# Patient Record
Sex: Female | Born: 1950 | Race: Black or African American | Hispanic: No | Smoking: Never smoker
Health system: Southern US, Community
[De-identification: ages and names within clinical notes are randomized; demographics above are authoritative.]

## PROBLEM LIST (undated history)

## (undated) DIAGNOSIS — E119 Type 2 diabetes mellitus without complications: Secondary | ICD-10-CM

## (undated) DIAGNOSIS — I10 Essential (primary) hypertension: Secondary | ICD-10-CM

## (undated) HISTORY — PX: ABDOMINAL HYSTERECTOMY: SHX81

---

## 1998-05-09 ENCOUNTER — Other Ambulatory Visit: Admission: RE | Admit: 1998-05-09 | Discharge: 1998-05-09 | Payer: Self-pay | Admitting: Obstetrics & Gynecology

## 1998-05-11 ENCOUNTER — Ambulatory Visit (HOSPITAL_COMMUNITY): Admission: RE | Admit: 1998-05-11 | Discharge: 1998-05-11 | Payer: Self-pay | Admitting: Obstetrics & Gynecology

## 1998-05-12 ENCOUNTER — Ambulatory Visit (HOSPITAL_COMMUNITY): Admission: RE | Admit: 1998-05-12 | Discharge: 1998-05-12 | Payer: Self-pay | Admitting: *Deleted

## 1998-05-19 ENCOUNTER — Ambulatory Visit (HOSPITAL_COMMUNITY): Admission: RE | Admit: 1998-05-19 | Discharge: 1998-05-19 | Payer: Self-pay | Admitting: *Deleted

## 1998-08-15 ENCOUNTER — Ambulatory Visit (HOSPITAL_COMMUNITY): Admission: RE | Admit: 1998-08-15 | Discharge: 1998-08-15 | Payer: Self-pay | Admitting: *Deleted

## 1998-11-30 ENCOUNTER — Encounter: Payer: Self-pay | Admitting: *Deleted

## 1998-11-30 ENCOUNTER — Ambulatory Visit (HOSPITAL_COMMUNITY): Admission: RE | Admit: 1998-11-30 | Discharge: 1998-11-30 | Payer: Self-pay | Admitting: *Deleted

## 1999-06-22 ENCOUNTER — Ambulatory Visit (HOSPITAL_COMMUNITY): Admission: RE | Admit: 1999-06-22 | Discharge: 1999-06-22 | Payer: Self-pay | Admitting: *Deleted

## 1999-06-22 ENCOUNTER — Encounter: Payer: Self-pay | Admitting: *Deleted

## 1999-06-29 ENCOUNTER — Encounter: Payer: Self-pay | Admitting: *Deleted

## 1999-06-29 ENCOUNTER — Ambulatory Visit (HOSPITAL_COMMUNITY): Admission: RE | Admit: 1999-06-29 | Discharge: 1999-06-29 | Payer: Self-pay | Admitting: *Deleted

## 1999-10-05 ENCOUNTER — Other Ambulatory Visit: Admission: RE | Admit: 1999-10-05 | Discharge: 1999-10-05 | Payer: Self-pay | Admitting: Obstetrics & Gynecology

## 2000-01-19 ENCOUNTER — Encounter: Payer: Self-pay | Admitting: *Deleted

## 2000-01-19 ENCOUNTER — Encounter: Admission: RE | Admit: 2000-01-19 | Discharge: 2000-01-19 | Payer: Self-pay | Admitting: *Deleted

## 2000-07-18 ENCOUNTER — Encounter: Payer: Self-pay | Admitting: *Deleted

## 2000-07-18 ENCOUNTER — Encounter: Admission: RE | Admit: 2000-07-18 | Discharge: 2000-07-18 | Payer: Self-pay | Admitting: *Deleted

## 2001-11-27 ENCOUNTER — Other Ambulatory Visit: Admission: RE | Admit: 2001-11-27 | Discharge: 2001-11-27 | Payer: Self-pay | Admitting: Obstetrics and Gynecology

## 2001-12-03 ENCOUNTER — Ambulatory Visit (HOSPITAL_COMMUNITY): Admission: RE | Admit: 2001-12-03 | Discharge: 2001-12-03 | Payer: Self-pay | Admitting: Obstetrics and Gynecology

## 2001-12-03 ENCOUNTER — Encounter: Payer: Self-pay | Admitting: Obstetrics and Gynecology

## 2002-05-09 ENCOUNTER — Encounter: Payer: Self-pay | Admitting: Emergency Medicine

## 2002-05-09 ENCOUNTER — Emergency Department (HOSPITAL_COMMUNITY): Admission: EM | Admit: 2002-05-09 | Discharge: 2002-05-09 | Payer: Self-pay | Admitting: Emergency Medicine

## 2003-05-21 ENCOUNTER — Encounter: Payer: Self-pay | Admitting: Internal Medicine

## 2003-05-21 ENCOUNTER — Ambulatory Visit (HOSPITAL_COMMUNITY): Admission: RE | Admit: 2003-05-21 | Discharge: 2003-05-21 | Payer: Self-pay | Admitting: Internal Medicine

## 2006-01-21 ENCOUNTER — Ambulatory Visit (HOSPITAL_COMMUNITY): Admission: RE | Admit: 2006-01-21 | Discharge: 2006-01-21 | Payer: Self-pay | Admitting: Internal Medicine

## 2007-01-24 ENCOUNTER — Ambulatory Visit (HOSPITAL_COMMUNITY): Admission: RE | Admit: 2007-01-24 | Discharge: 2007-01-24 | Payer: Self-pay | Admitting: Internal Medicine

## 2010-11-09 ENCOUNTER — Encounter
Admission: RE | Admit: 2010-11-09 | Discharge: 2010-11-09 | Payer: Self-pay | Source: Home / Self Care | Attending: Internal Medicine | Admitting: Internal Medicine

## 2011-12-18 ENCOUNTER — Other Ambulatory Visit (HOSPITAL_COMMUNITY): Payer: Self-pay | Admitting: Internal Medicine

## 2011-12-18 DIAGNOSIS — Z1231 Encounter for screening mammogram for malignant neoplasm of breast: Secondary | ICD-10-CM

## 2011-12-19 ENCOUNTER — Ambulatory Visit (HOSPITAL_COMMUNITY): Payer: Self-pay

## 2011-12-27 ENCOUNTER — Ambulatory Visit (HOSPITAL_COMMUNITY): Payer: Self-pay

## 2012-01-24 ENCOUNTER — Ambulatory Visit (HOSPITAL_COMMUNITY)
Admission: RE | Admit: 2012-01-24 | Discharge: 2012-01-24 | Disposition: A | Discharge status: Home / Self Care | Payer: BC Managed Care – PPO | Source: Ambulatory Visit | Attending: Internal Medicine | Admitting: Internal Medicine

## 2012-01-24 DIAGNOSIS — Z1231 Encounter for screening mammogram for malignant neoplasm of breast: Secondary | ICD-10-CM

## 2012-07-16 ENCOUNTER — Encounter (HOSPITAL_COMMUNITY): Payer: Self-pay | Admitting: Emergency Medicine

## 2012-07-16 ENCOUNTER — Emergency Department (HOSPITAL_COMMUNITY)
Admission: EM | Admit: 2012-07-16 | Discharge: 2012-07-16 | Disposition: A | Discharge status: Home / Self Care | Payer: BC Managed Care – PPO | Attending: Emergency Medicine | Admitting: Emergency Medicine

## 2012-07-16 DIAGNOSIS — K5289 Other specified noninfective gastroenteritis and colitis: Secondary | ICD-10-CM | POA: Insufficient documentation

## 2012-07-16 DIAGNOSIS — I10 Essential (primary) hypertension: Secondary | ICD-10-CM | POA: Insufficient documentation

## 2012-07-16 DIAGNOSIS — K529 Noninfective gastroenteritis and colitis, unspecified: Secondary | ICD-10-CM

## 2012-07-16 DIAGNOSIS — E119 Type 2 diabetes mellitus without complications: Secondary | ICD-10-CM | POA: Insufficient documentation

## 2012-07-16 HISTORY — DX: Essential (primary) hypertension: I10

## 2012-07-16 HISTORY — DX: Type 2 diabetes mellitus without complications: E11.9

## 2012-07-16 LAB — COMPREHENSIVE METABOLIC PANEL
ALT: 26 U/L (ref 0–35)
AST: 21 U/L (ref 0–37)
CO2: 21 mEq/L (ref 19–32)
Calcium: 10.1 mg/dL (ref 8.4–10.5)
Chloride: 101 mEq/L (ref 96–112)
Creatinine, Ser: 1.38 mg/dL — ABNORMAL HIGH (ref 0.50–1.10)
GFR calc Af Amer: 46 mL/min — ABNORMAL LOW (ref >90)
GFR calc non Af Amer: 40 mL/min — ABNORMAL LOW (ref >90)
Glucose, Bld: 148 mg/dL — ABNORMAL HIGH (ref 70–99)
Total Bilirubin: 0.3 mg/dL (ref 0.3–1.2)

## 2012-07-16 LAB — CBC WITH DIFFERENTIAL/PLATELET
Basophils Absolute: 0.1 10*3/uL (ref 0.0–0.1)
Eosinophils Relative: 3 % (ref 0–5)
HCT: 39 % (ref 36.0–46.0)
Hemoglobin: 12.9 g/dL (ref 12.0–15.0)
Lymphocytes Relative: 37 % (ref 12–46)
Lymphs Abs: 3.7 10*3/uL (ref 0.7–4.0)
MCV: 82.6 fL (ref 78.0–100.0)
Monocytes Absolute: 0.5 10*3/uL (ref 0.1–1.0)
Neutro Abs: 5.6 10*3/uL (ref 1.7–7.7)
RBC: 4.72 MIL/uL (ref 3.87–5.11)
WBC: 10.2 10*3/uL (ref 4.0–10.5)

## 2012-07-16 LAB — GLUCOSE, CAPILLARY

## 2012-07-16 MED ORDER — PANTOPRAZOLE SODIUM 40 MG PO TBEC
40.0000 mg | DELAYED_RELEASE_TABLET | Freq: Once | ORAL | Status: AC
  Administered 2012-07-16: 40 mg via ORAL
  Filled 2012-07-16: qty 1

## 2012-07-16 NOTE — ED Provider Notes (Signed)
History     CSN: [id number]  Arrival date & time 07/16/12  1225   First MD Initiated Contact with Patient 07/16/12 1246      Chief Complaint  Patient presents with  . Nausea  . Emesis  . Diarrhea  . Anorexia    (Consider location/radiation/quality/duration/timing/severity/associated sxs/prior treatment) Patient is a 61 y.o. female presenting with vomiting and diarrhea. The history is provided by the patient (pt complains of nauseau and diarhea which has imporved). No language interpreter was used.  Emesis  This is a new problem. The current episode started more than 2 days ago. The problem has been resolved. The emesis has an appearance of stomach contents. There has been no fever. The fever has been present for 3 to 4 days. Associated symptoms include diarrhea. Pertinent negatives include no abdominal pain, no cough and no headaches.  Diarrhea The primary symptoms include nausea, vomiting and diarrhea. Primary symptoms do not include fatigue, abdominal pain or rash.  The illness does not include back pain.    Past Medical History  Diagnosis Date  . Diabetes mellitus   . Hypertension     History reviewed. No pertinent past surgical history.  History reviewed. No pertinent family history.  History  Substance Use Topics  . Smoking status: Never Smoker   . Smokeless tobacco: Not on file  . Alcohol Use: No    OB History    Grav Para Term Preterm Abortions TAB SAB Ect Mult Living                  Review of Systems  Constitutional: Negative for fatigue.  HENT: Negative for congestion, sinus pressure and ear discharge.   Eyes: Negative for discharge.  Respiratory: Negative for cough.   Cardiovascular: Negative for chest pain.  Gastrointestinal: Positive for nausea, vomiting and diarrhea. Negative for abdominal pain.  Genitourinary: Negative for frequency and hematuria.  Musculoskeletal: Negative for back pain.  Skin: Negative for rash.  Neurological: Negative for  seizures and headaches.  Hematological: Negative.   Psychiatric/Behavioral: Negative for hallucinations.    Allergies  Review of patient's allergies indicates no known allergies.  Home Medications   Current Outpatient Rx  Name Route Sig Dispense Refill  . ASPIRIN 81 MG PO TABS Oral Take 81 mg by mouth daily.    Marland Kitchen DILTIAZEM HCL ER 180 MG PO CP24 Oral Take 360 mg by mouth daily.    . IRBESARTAN 150 MG PO TABS Oral Take 150 mg by mouth at bedtime.    Marland Kitchen METFORMIN HCL 1000 MG PO TABS Oral Take 500 mg by mouth 2 (two) times daily with a meal.    . TRIAMTERENE-HCTZ 37.5-25 MG PO TABS Oral Take 1 tablet by mouth daily.      BP 138/88  Pulse 95  Temp 98 F (36.7 C) (Oral)  Resp 16  Ht 5\' 4"  (1.626 m)  Wt 235 lb (106.595 kg)  BMI 40.34 kg/m2  SpO2 99%  Physical Exam  Constitutional: She is oriented to person, place, and time. She appears well-developed.  HENT:  Head: Normocephalic and atraumatic.  Eyes: Conjunctivae normal and EOM are normal. No scleral icterus.  Neck: Neck supple. No thyromegaly present.  Cardiovascular: Normal rate and regular rhythm.  Exam reveals no gallop and no friction rub.   No murmur heard. Pulmonary/Chest: No stridor. She has no wheezes. She has no rales. She exhibits no tenderness.  Abdominal: She exhibits no distension. There is no tenderness. There is no rebound.  Musculoskeletal:  Normal range of motion. She exhibits no edema.  Lymphadenopathy:    She has no cervical adenopathy.  Neurological: She is oriented to person, place, and time. Coordination normal.  Skin: No rash noted. No erythema.  Psychiatric: She has a normal mood and affect. Her behavior is normal.    ED Course  Procedures (including critical care time)  Labs Reviewed  COMPREHENSIVE METABOLIC PANEL - Abnormal; Notable for the following:    Glucose, Bld 148 (*)     Creatinine, Ser 1.38 (*)     GFR calc non Af Amer 40 (*)     GFR calc Af Amer 46 (*)     All other components  within normal limits  GLUCOSE, CAPILLARY - Abnormal; Notable for the following:    Glucose-Capillary 123 (*)     All other components within normal limits  CBC WITH DIFFERENTIAL   No results found.   1. Gastroenteritis       MDM  The chart was scribed for me under my direct supervision.  I personally performed the history, physical, and medical decision making and all procedures in the evaluation of this patient.Benny Lennert, MD 07/16/12 [phone]

## 2012-07-16 NOTE — ED Notes (Signed)
Pt alert and oriented x4. Respirations even and unlabored, bilateral symmetrical rise and fall of chest. Skin warm and dry. In no acute distress. Denies needs.   

## 2012-07-16 NOTE — ED Notes (Signed)
Patient reports N/V/D and mid-abdominal pain since last Thursday.  Patient is still experiencing nausea and weakness.  Patient denies fevers.

## 2012-07-16 NOTE — ED Notes (Signed)
[id number]<AV> Expected date:<BR> Expected time:<BR> Means of arrival:<BR> Comments:<BR> Triage 1

## 2016-08-27 ENCOUNTER — Other Ambulatory Visit: Payer: Self-pay | Admitting: Internal Medicine

## 2016-08-27 DIAGNOSIS — Z1231 Encounter for screening mammogram for malignant neoplasm of breast: Secondary | ICD-10-CM

## 2016-08-29 ENCOUNTER — Ambulatory Visit
Admission: RE | Admit: 2016-08-29 | Discharge: 2016-08-29 | Disposition: A | Discharge status: Home / Self Care | Payer: Medicare Other | Source: Ambulatory Visit | Attending: Internal Medicine | Admitting: Internal Medicine

## 2016-08-29 DIAGNOSIS — Z1231 Encounter for screening mammogram for malignant neoplasm of breast: Secondary | ICD-10-CM

## 2017-08-30 ENCOUNTER — Other Ambulatory Visit: Payer: Self-pay | Admitting: Internal Medicine

## 2017-08-30 DIAGNOSIS — Z1231 Encounter for screening mammogram for malignant neoplasm of breast: Secondary | ICD-10-CM

## 2017-08-30 DIAGNOSIS — E2839 Other primary ovarian failure: Secondary | ICD-10-CM

## 2017-09-13 ENCOUNTER — Ambulatory Visit
Admission: RE | Admit: 2017-09-13 | Discharge: 2017-09-13 | Disposition: A | Discharge status: Home / Self Care | Payer: Medicare Other | Source: Ambulatory Visit | Attending: Internal Medicine | Admitting: Internal Medicine

## 2017-09-13 DIAGNOSIS — Z1231 Encounter for screening mammogram for malignant neoplasm of breast: Secondary | ICD-10-CM

## 2017-09-28 ENCOUNTER — Other Ambulatory Visit: Payer: Self-pay

## 2017-09-28 ENCOUNTER — Emergency Department (HOSPITAL_BASED_OUTPATIENT_CLINIC_OR_DEPARTMENT_OTHER): Payer: Medicare Other

## 2017-09-28 ENCOUNTER — Encounter (HOSPITAL_BASED_OUTPATIENT_CLINIC_OR_DEPARTMENT_OTHER): Payer: Self-pay | Admitting: *Deleted

## 2017-09-28 ENCOUNTER — Emergency Department (HOSPITAL_BASED_OUTPATIENT_CLINIC_OR_DEPARTMENT_OTHER)
Admission: EM | Admit: 2017-09-28 | Discharge: 2017-09-28 | Disposition: A | Discharge status: Home / Self Care | Payer: Medicare Other | Attending: Emergency Medicine | Admitting: Emergency Medicine

## 2017-09-28 DIAGNOSIS — Z79899 Other long term (current) drug therapy: Secondary | ICD-10-CM | POA: Insufficient documentation

## 2017-09-28 DIAGNOSIS — K529 Noninfective gastroenteritis and colitis, unspecified: Secondary | ICD-10-CM | POA: Insufficient documentation

## 2017-09-28 DIAGNOSIS — I10 Essential (primary) hypertension: Secondary | ICD-10-CM | POA: Diagnosis not present

## 2017-09-28 DIAGNOSIS — Z7984 Long term (current) use of oral hypoglycemic drugs: Secondary | ICD-10-CM | POA: Diagnosis not present

## 2017-09-28 DIAGNOSIS — E119 Type 2 diabetes mellitus without complications: Secondary | ICD-10-CM | POA: Insufficient documentation

## 2017-09-28 DIAGNOSIS — Z7982 Long term (current) use of aspirin: Secondary | ICD-10-CM | POA: Diagnosis not present

## 2017-09-28 DIAGNOSIS — R109 Unspecified abdominal pain: Secondary | ICD-10-CM | POA: Diagnosis present

## 2017-09-28 LAB — I-STAT CG4 LACTIC ACID, ED: LACTIC ACID, VENOUS: 1.29 mmol/L (ref 0.5–1.9)

## 2017-09-28 LAB — CBC WITH DIFFERENTIAL/PLATELET
BLASTS: 0 %
Band Neutrophils: 0 %
Basophils Absolute: 0.2 10*3/uL — ABNORMAL HIGH (ref 0.0–0.1)
Basophils Relative: 1 %
EOS PCT: 2 %
Eosinophils Absolute: 0.3 10*3/uL (ref 0.0–0.7)
HEMATOCRIT: 40.3 % (ref 36.0–46.0)
HEMOGLOBIN: 13.3 g/dL (ref 12.0–15.0)
Lymphocytes Relative: 16 %
Lymphs Abs: 2.5 10*3/uL (ref 0.7–4.0)
MCH: 27.6 pg (ref 26.0–34.0)
MCHC: 33 g/dL (ref 30.0–36.0)
MCV: 83.6 fL (ref 78.0–100.0)
MYELOCYTES: 0 %
Metamyelocytes Relative: 0 %
Monocytes Absolute: 0.8 10*3/uL (ref 0.1–1.0)
Monocytes Relative: 5 %
NEUTROS PCT: 76 %
NRBC: 0 /100{WBCs}
Neutro Abs: 12 10*3/uL — ABNORMAL HIGH (ref 1.7–7.7)
PROMYELOCYTES ABS: 0 %
Platelets: 307 10*3/uL (ref 150–400)
RBC: 4.82 MIL/uL (ref 3.87–5.11)
RDW: 14.5 % (ref 11.5–15.5)
WBC: 15.8 10*3/uL — AB (ref 4.0–10.5)

## 2017-09-28 LAB — COMPREHENSIVE METABOLIC PANEL
ALK PHOS: 96 U/L (ref 38–126)
ALT: 16 U/L (ref 14–54)
ANION GAP: 6 (ref 5–15)
AST: 16 U/L (ref 15–41)
Albumin: 4 g/dL (ref 3.5–5.0)
BILIRUBIN TOTAL: 1 mg/dL (ref 0.3–1.2)
BUN: 21 mg/dL — ABNORMAL HIGH (ref 6–20)
CALCIUM: 9.8 mg/dL (ref 8.9–10.3)
CO2: 24 mmol/L (ref 22–32)
CREATININE: 1.24 mg/dL — AB (ref 0.44–1.00)
Chloride: 105 mmol/L (ref 101–111)
GFR calc non Af Amer: 44 mL/min — ABNORMAL LOW (ref >60)
GFR, EST AFRICAN AMERICAN: 51 mL/min — AB (ref >60)
GLUCOSE: 162 mg/dL — AB (ref 65–99)
Potassium: 4.1 mmol/L (ref 3.5–5.1)
Sodium: 135 mmol/L (ref 135–145)
TOTAL PROTEIN: 7.7 g/dL (ref 6.5–8.1)

## 2017-09-28 LAB — CBG MONITORING, ED: GLUCOSE-CAPILLARY: 155 mg/dL — AB (ref 65–99)

## 2017-09-28 LAB — LIPASE, BLOOD: Lipase: 29 U/L (ref 11–51)

## 2017-09-28 MED ORDER — HYDROCODONE-ACETAMINOPHEN 5-325 MG PO TABS: 1.0000 | ORAL_TABLET | Freq: Four times a day (QID) | ORAL | 0 refills | Status: AC | PRN

## 2017-09-28 MED ORDER — IOPAMIDOL (ISOVUE-300) INJECTION 61%
100.0000 mL | Freq: Once | INTRAVENOUS | Status: AC | PRN
  Administered 2017-09-28: 100 mL via INTRAVENOUS

## 2017-09-28 MED ORDER — ONDANSETRON HCL 4 MG/2ML IJ SOLN
4.0000 mg | Freq: Once | INTRAMUSCULAR | Status: AC
  Administered 2017-09-28: 4 mg via INTRAVENOUS
  Filled 2017-09-28: qty 2

## 2017-09-28 MED ORDER — ONDANSETRON 4 MG PO TBDP: 4.0000 mg | ORAL_TABLET | Freq: Three times a day (TID) | ORAL | 0 refills | Status: AC | PRN

## 2017-09-28 MED ORDER — SODIUM CHLORIDE 0.9 % IV BOLUS (SEPSIS)
1000.0000 mL | Freq: Once | INTRAVENOUS | Status: AC
  Administered 2017-09-28: 1000 mL via INTRAVENOUS

## 2017-09-28 MED ORDER — ACETAMINOPHEN 325 MG PO TABS
650.0000 mg | ORAL_TABLET | Freq: Once | ORAL | Status: AC
  Administered 2017-09-28: 650 mg via ORAL
  Filled 2017-09-28: qty 2

## 2017-09-28 MED ORDER — MORPHINE SULFATE (PF) 4 MG/ML IV SOLN
4.0000 mg | Freq: Once | INTRAVENOUS | Status: AC
  Administered 2017-09-28: 4 mg via INTRAVENOUS
  Filled 2017-09-28: qty 1

## 2017-09-28 NOTE — ED Triage Notes (Signed)
Pt reports mid-abd pain radiating to R side, n/v, chills that began yesterday. Denies diarrhea. MD at bedside during triage.

## 2017-09-28 NOTE — ED Notes (Signed)
Pt educated about not driving or performing critical tasks (such as operating heavy machinery or caring for an infant/toddler/child) while taking narcotics. Educated about not mixing narcotics with alcohol and/or other sedatives (such as muscle relaxers, Benadryl). Also educated about addicting properties of narcotics. Pt/caregiver verbalized understanding.   

## 2017-09-28 NOTE — Discharge Instructions (Signed)
Return immediately if you develop severe pain that is not controlled by the medications, high fevers or intractable nausea and vomiting.

## 2017-09-28 NOTE — ED Notes (Signed)
ED Provider at bedside. 

## 2017-09-28 NOTE — ED Provider Notes (Signed)
[facility] EMERGENCY DEPARTMENT Provider Note   CSN: [id number] Arrival date & time: 09/28/17  [id number]     History   Chief Complaint No chief complaint on file.   HPI Sonia Fernandez is a 66 y.o. female.  Patient is a 66 year old female with a history of diabetes and hypertension presenting today with abdominal pain, nausea and vomiting.  Patient states throughout the night on Friday night and early Saturday morning or when her symptoms started.  She describes it as an intense gnawing, moving colicky abdominal pain that at times are 10 out of 10 but has been constant all day yesterday and today.  States the pain does not radiate.  She had one small loose stool but no frank diarrhea.  She had one episode of vomiting yesterday morning after trying to drink some coffee.  She has not been able to eat or drink throughout the day yesterday due to the pain.  She denies any recent travel but does state her Sonia Fernandez has very similar symptoms and is [facility] at [facility] same.  She states they do not know what are causing her symptoms.  Patient has not recently had any antibiotics and has not taken her medication for the last 2 days due to symptoms.  Only abdominal surgery was a partial hysterectomy and 2 C-sections.  No similar symptoms.  She denies any blood in her stool or vomitus.   The history is provided by the patient.    Past Medical History:  Diagnosis Date  . Diabetes mellitus (HCC)   . Hypertension     There are no active problems to display for this patient.   No past surgical history on file.  OB History    No data available       Home Medications    Prior to Admission medications   Medication Sig Start Date End Date Taking? Authorizing Provider  aspirin 81 MG tablet Take 81 mg by mouth daily.    [provider]  diltiazem (DILACOR XR) 180 MG 24 hr capsule Take 360 mg by mouth daily.    [provider]  irbesartan  (AVAPRO) 150 MG tablet Take 150 mg by mouth at bedtime.    [provider]  metFORMIN (GLUCOPHAGE) 1000 MG tablet Take 500 mg by mouth 2 (two) times daily with a meal.    [provider]  triamterene-hydrochlorothiazide (MAXZIDE-25) 37.5-25 MG per tablet Take 1 tablet by mouth daily.    [provider]    Family History No family history on file.  Social History Social History   Tobacco Use  . Smoking status: Never Smoker  Substance Use Topics  . Alcohol use: No  . Drug use: No     Allergies   Patient has no known allergies.   Review of Systems Review of Systems  All other systems reviewed and are negative.    Physical Exam Updated Vital Signs BP 115/84 (BP Location: Left Arm)   Pulse (!) 120   Temp 100.1 F (37.8 C) (Oral)   Resp 18   Ht 5\' 3"  (1.6 m)   Wt 89.8 kg (198 lb)   SpO2 100%   BMI 35.07 kg/m   Physical Exam  Constitutional: She is oriented to person, place, and time. She appears well-developed and well-nourished. No distress.  HENT:  Head: Normocephalic and atraumatic.  Mouth/Throat: Oropharynx is clear and moist. Mucous membranes are dry.  Eyes: Conjunctivae and EOM are normal. Pupils are  equal, round, and reactive to light.  Neck: Normal range of motion. Neck supple.  Cardiovascular: Regular rhythm and intact distal pulses. Tachycardia present.  No murmur heard. Pulmonary/Chest: Effort normal and breath sounds normal. No respiratory distress. She has no wheezes. She has no rales.  Abdominal: Soft. Bowel sounds are normal. She exhibits no distension. There is tenderness in the right upper quadrant, right lower quadrant, epigastric area and left upper quadrant. There is guarding. There is no rebound and no CVA tenderness.  Musculoskeletal: Normal range of motion. She exhibits no edema or tenderness.  Neurological: She is alert and oriented to person, place, and time.  Skin: Skin is warm and dry. No rash noted. No erythema.    Psychiatric: She has a normal mood and affect. Her behavior is normal.  Nursing note and vitals reviewed.    ED Treatments / Results  Labs (all labs ordered are listed, but only abnormal results are displayed) Labs Reviewed  CBC WITH DIFFERENTIAL/PLATELET - Abnormal; Notable for the following components:      Result Value   WBC 15.8 (*)    Neutro Abs 12.0 (*)    Basophils Absolute 0.2 (*)    All other components within normal limits  COMPREHENSIVE METABOLIC PANEL - Abnormal; Notable for the following components:   Glucose, Bld 162 (*)    BUN 21 (*)    Creatinine, Ser 1.24 (*)    GFR calc non Af Amer 44 (*)    GFR calc Af Amer 51 (*)    All other components within normal limits  CBG MONITORING, ED - Abnormal; Notable for the following components:   Glucose-Capillary 155 (*)    All other components within normal limits  LIPASE, BLOOD  I-STAT CG4 LACTIC ACID, ED    EKG  EKG Interpretation None       Radiology Ct Abdomen Pelvis W Contrast  Result Date: 09/28/2017 CLINICAL DATA:  66 year old female with 2 days of right greater than left abdominal pain with chills nausea vomiting and diarrhea. Leukocytosis. EXAM: CT ABDOMEN AND PELVIS WITH CONTRAST TECHNIQUE: Multidetector CT imaging of the abdomen and pelvis was performed using the standard protocol following bolus administration of intravenous contrast. CONTRAST:  ISOVUE-300 IOPAMIDOL (ISOVUE-300) INJECTION 61% COMPARISON:  None. FINDINGS: Lower chest: Bilateral lower lobe atelectasis or scarring. Borderline to mild cardiomegaly. Calcified left coronary artery atherosclerosis. No pericardial or pleural effusion. Hepatobiliary: Hepatic steatosis. Gallbladder is within normal limits ; see hepatic flexure: Findings below. Pancreas: Negative. Spleen: Negative. Adrenals/Urinary Tract: Normal adrenal glands. Right lower pole nephrolithiasis measuring 3-4 mm. Bilateral renal enhancement and contrast excretion is symmetric and  normal. No perinephric stranding. No hydronephrosis. Negative proximal ureters. Unremarkable urinary bladder. Stomach/Bowel: Negative rectum. Redundant but otherwise negative sigmoid colon. Negative descending colon and splenic flexure. The distal transverse colon is within normal limits. Abnormal hepatic flexure and proximal transverse colon with an 8-9 cm segment of severe colonic wall thickening and moderate to severe surrounding mesenteric inflammation. See coronal image 24). Gradual tapering of the wall along the proximal and distal margins of the abnormality. The abnormal bowel is in proximity to the gallbladder which appears to remain normal. There is no mesenteric or regional lymphadenopathy. No extraluminal gas. No abdominal free fluid. The right colon and appendix are normal. Negative terminal ileum. No dilated or abnormal small bowel loops. Stomach and duodenum are within normal limits. Vascular/Lymphatic: Mild Calcified aortic atherosclerosis. Major arterial structures in the abdomen and pelvis are patent. There is more pronounced bilateral internal  iliac artery atherosclerosis. The portal venous system is patent. No lymphadenopathy. Reproductive: Surgically absent uterus. Ovaries are within normal limits. Other: No pelvic free fluid. Postoperative changes to the ventral lower abdominal wall with no adverse features. Musculoskeletal: No acute or suspicious osseous lesion identified. Multilevel spinal degeneration including severe lower lumbar facet and disc degeneration. Asymmetric right hip degeneration. IMPRESSION: 1. Severe wall thickening and inflammation of an 8-9 cm segment of the proximal transverse colon beginning in the right upper quadrant. The gradual transition and absence of regional lymphadenopathy favors colitis over adenocarcinoma. Post treatment follow-up recommended. 2. No evidence of perforation or abscess. No other acute finding in the abdomen or pelvis. 3. Hepatic steatosis. Right  nephrolithiasis. Lumbar spine and right hip degeneration. Electronically Signed   By: Odessa FlemingH  Hall M.D.   On: 09/28/2017 10:33    Procedures Procedures (including critical care time)  Medications Ordered in ED Medications  sodium chloride 0.9 % bolus 1,000 mL (0 mLs Intravenous Stopped 09/28/17 0950)  morphine 4 MG/ML injection 4 mg (4 mg Intravenous Given 09/28/17 0910)  ondansetron (ZOFRAN) injection 4 mg (4 mg Intravenous Given 09/28/17 0907)  acetaminophen (TYLENOL) tablet 650 mg (650 mg Oral Given 09/28/17 0908)  iopamidol (ISOVUE-300) 61 % injection 100 mL (100 mLs Intravenous Contrast Given 09/28/17 0956)     Initial Impression / Assessment and Plan / ED Course  I have reviewed the triage vital signs and the nursing notes.  Pertinent labs & imaging results that were available during my care of the patient were reviewed by me and considered in my medical decision making (see chart for details).     66 year old female presenting with abdominal pain and vomiting.  Patient has diffuse pain but more localized on the right side.  Upon arrival patient had a temperature of 100.1 with tachycardia of 120.  Patient has normal blood pressure and was sitting down heart rate improved to 85.  Low suspicion for sepsis at this time but does meet some SIRS criteria.  Concern for possible viral etiology versus colitis versus diverticulitis versus appendicitis.  Lower suspicion for hepatitis or pancreatitis.  CBC, CMP, lipase, lactate pending.  CT of the abdomen and pelvis pending.  Patient given IV fluids, pain and nausea control as well as Tylenol for her fever.  10:09 AM Labs are normal except for leukocytosis of 15.  Ct pending  11:03 AM CT showed a focal colitis in the right side and 8-9 cm of bowel.  This is consistent with her symptoms.  Again Sonia Fernandez with same exact symptoms this is most likely viral in nature.  No known food borne illness and no recent antibiotics.  Patient is well-appearing and  after 1 dose of morphine Zofran and fluids she feels significantly better.  Discharged home with nausea medication.  She will try Tylenol but was given a short prescription of hydrocodone to use for breakthrough pain.  Encouraged follow-up with her doctor in 1 week but gave strict return precautions if symptoms worsen.  Final Clinical Impressions(s) / ED Diagnoses   Final diagnoses:  Colitis    ED Discharge Orders        Ordered    ondansetron (ZOFRAN ODT) 4 MG disintegrating tablet  Every 8 hours PRN     09/28/17 1101    HYDROcodone-acetaminophen (NORCO/VICODIN) 5-325 MG tablet  Every 6 hours PRN     09/28/17 1101       Gwyneth SproutPlunkett, Martise Waddell, MD 09/28/17 1105

## 2017-09-28 NOTE — ED Notes (Signed)
Pt educated about not driving or performing other critical tasks (such as operating heavy machinery, caring for infant/toddler/child) due to sedative nature of medications received in ED. Also warned about risks of consuming alcohol or taking other medications with sedative properties. Pt/caregiver verbalized understanding.  

## 2017-09-28 NOTE — ED Notes (Signed)
Patient transported to CT 

## 2017-09-28 NOTE — ED Notes (Signed)
Pt c/o nausea, vomiting, diarrhea with low ABD pain x2 days. Ambulatory unassisted, in NAD. Pt asks for blood sugar check due to diabetes and inability to take meds since sick.

## 2017-09-28 NOTE — ED Notes (Signed)
CT will need to wait for bun/creat/egfr to result prior to imaging with IV contrast, per protocol, pt >60 yrs of age and hx DM

## 2018-08-07 ENCOUNTER — Other Ambulatory Visit: Payer: Self-pay | Admitting: Internal Medicine

## 2018-08-07 DIAGNOSIS — Z1231 Encounter for screening mammogram for malignant neoplasm of breast: Secondary | ICD-10-CM

## 2018-09-19 ENCOUNTER — Ambulatory Visit
Admission: RE | Admit: 2018-09-19 | Discharge: 2018-09-19 | Disposition: A | Discharge status: Home / Self Care | Payer: Medicare Other | Source: Ambulatory Visit | Attending: Internal Medicine | Admitting: Internal Medicine

## 2018-09-19 DIAGNOSIS — Z1231 Encounter for screening mammogram for malignant neoplasm of breast: Secondary | ICD-10-CM

## 2018-11-08 IMAGING — CT CT ABD-PELV W/ CM
2 of 5 series · 15 of 46 positions shown, 17 images · IV contrast (APPLIED)
Comparison: None.

CLINICAL DATA: 66-year-old female with 2 days of right greater than
left abdominal pain with chills nausea vomiting and diarrhea.
Leukocytosis.

EXAM:
CT ABDOMEN AND PELVIS WITH CONTRAST
TECHNIQUE: Multidetector CT imaging of the abdomen and pelvis was performed
using the standard protocol following bolus administration of
intravenous contrast.
CONTRAST:  100mL [id number] IOPAMIDOL (OK5ECB-A88) INJECTION 61%

[Series 2: axial st · axial · 0.84mm/px · z∈[-422,-6]mm · 12 of 93 slices shown, 14 images]
[im 5/93  soft-tissue]
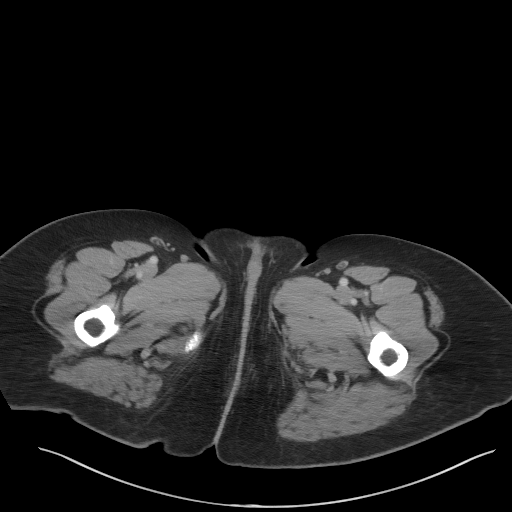
[im 5/93  bone]
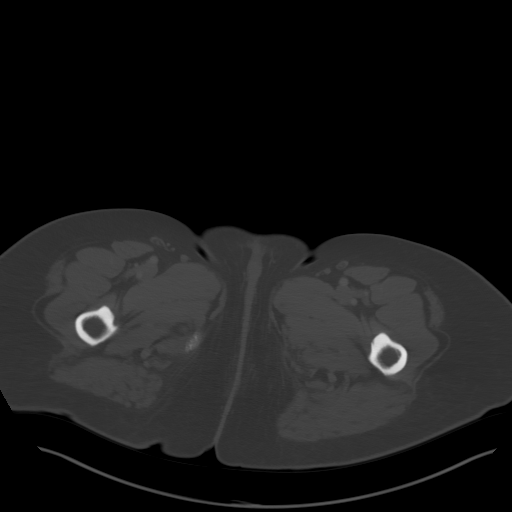
[im 15/93  soft-tissue]
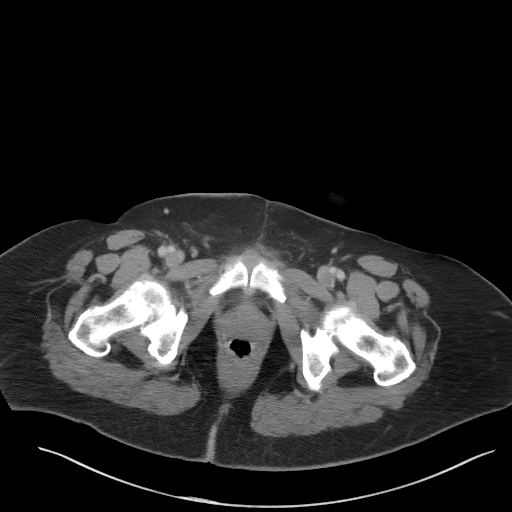
[im 20/93  soft-tissue]
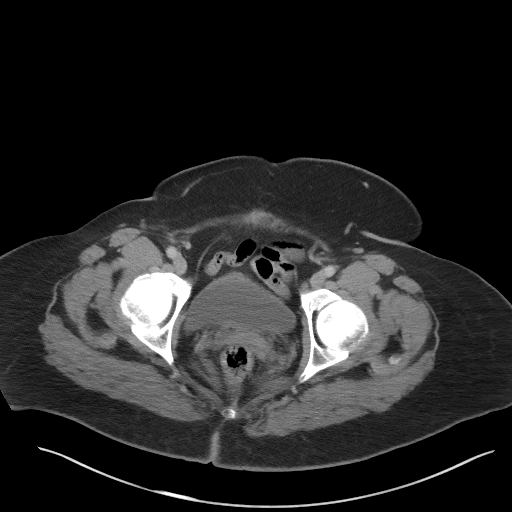
[im 30/93  soft-tissue]
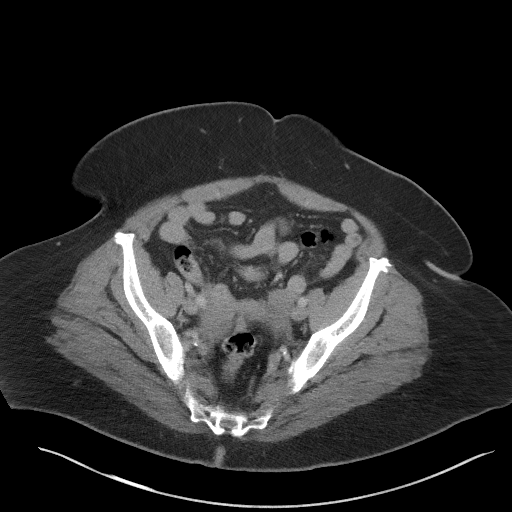
[im 34/93  soft-tissue]
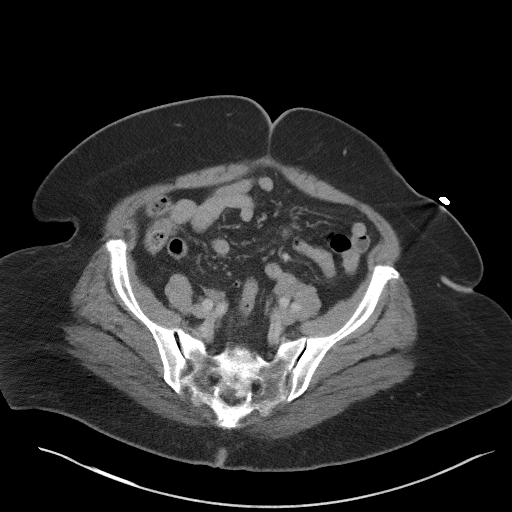
[im 44/93  soft-tissue]
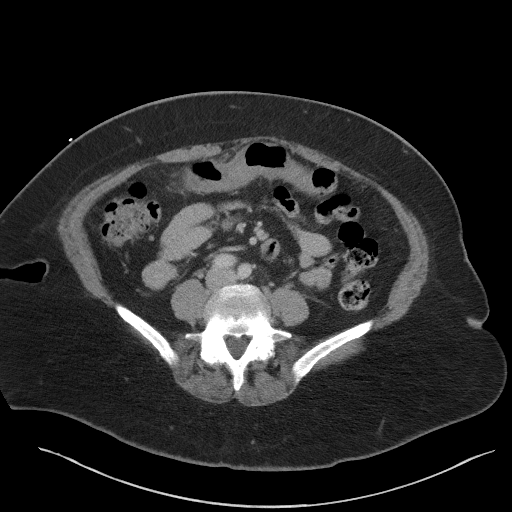
[im 49/93  soft-tissue]
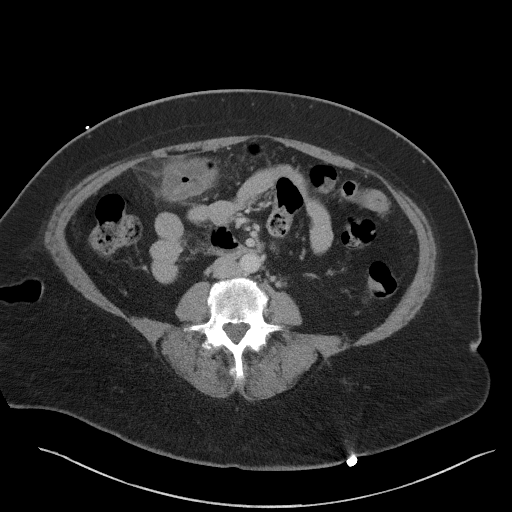
[im 59/93  soft-tissue]
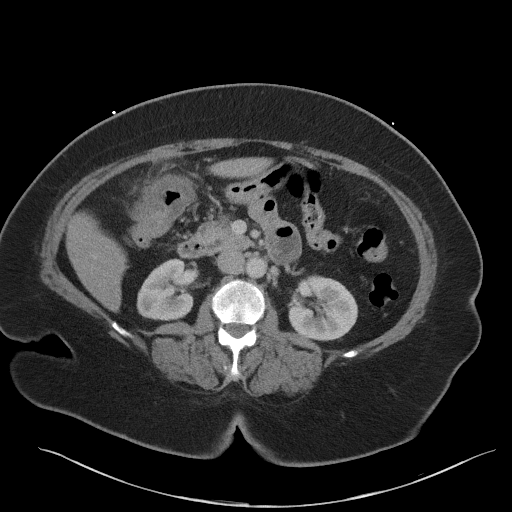
[im 63/93  soft-tissue]
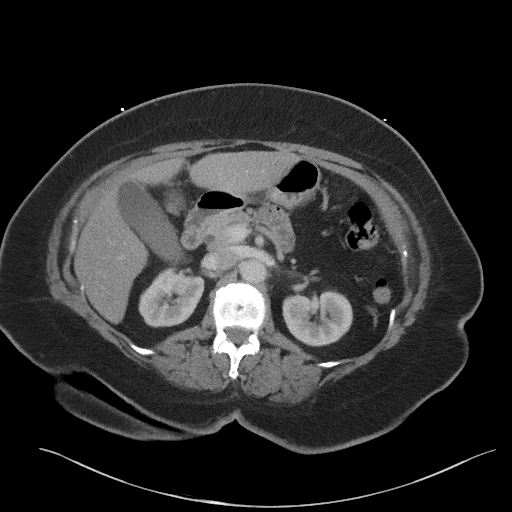
[im 63/93  bone]
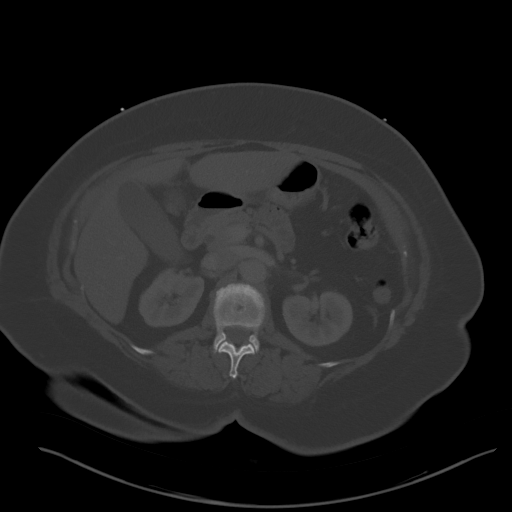
[im 73/93  soft-tissue]
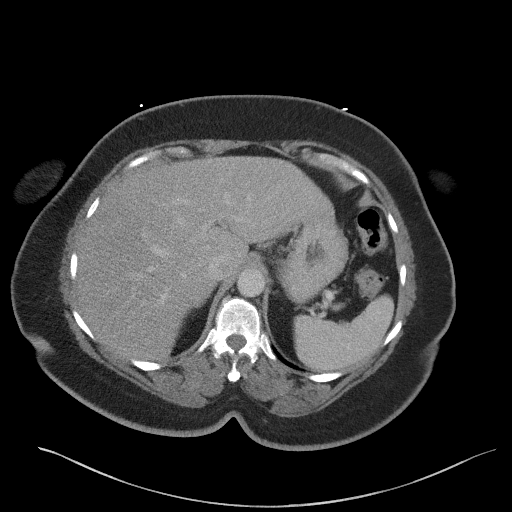
[im 78/93  soft-tissue]
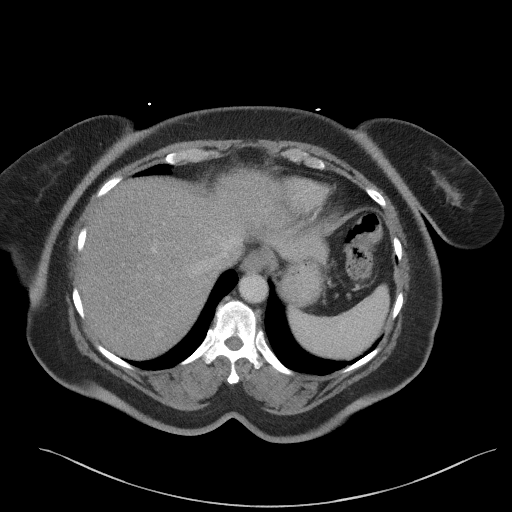
[im 88/93  soft-tissue]
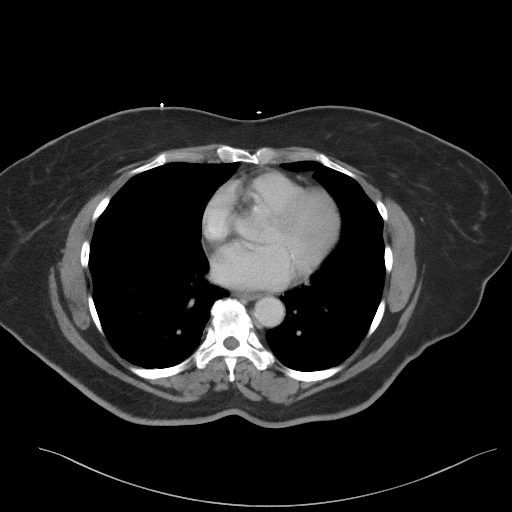

[Series 5: coronal st · coronal · 0.85mm/px · 3 of 94 slices shown]
[im 32/94  soft-tissue]
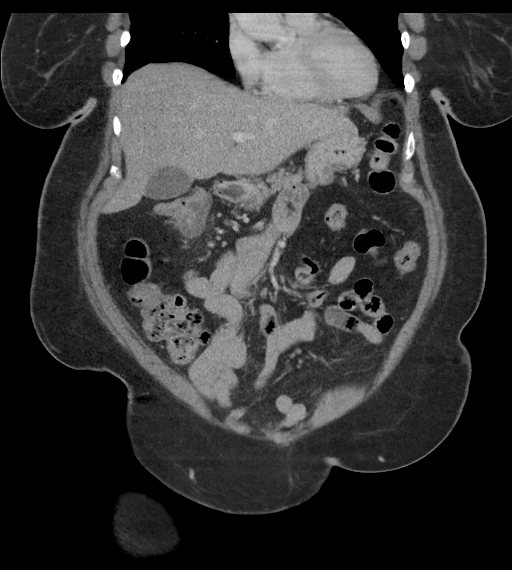
[im 42/94  soft-tissue]
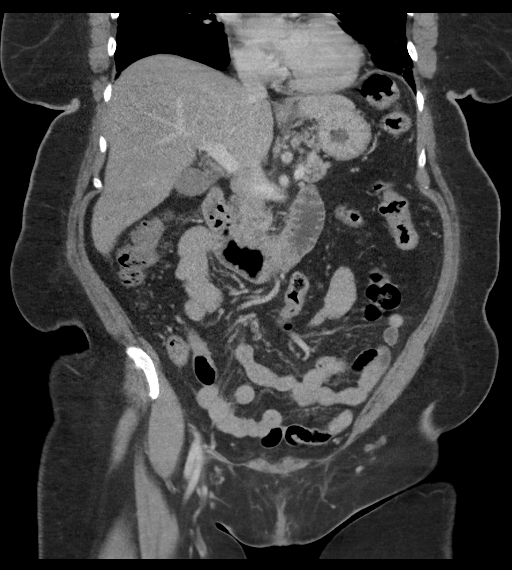
[im 52/94  soft-tissue]
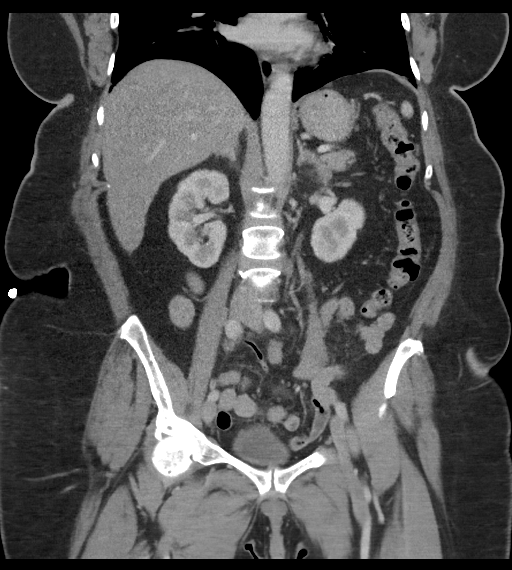

[15 of 46 positions shown; findings below may reference images not displayed]

FINDINGS: Lower chest: Bilateral lower lobe atelectasis or scarring.
Borderline to mild cardiomegaly. Calcified left coronary artery
atherosclerosis. No pericardial or pleural effusion.

Hepatobiliary: Hepatic steatosis. Gallbladder is within normal
limits ; see hepatic flexure: Findings below.

Pancreas: Negative.

Spleen: Negative.

Adrenals/Urinary Tract: Normal adrenal glands.

Right lower pole nephrolithiasis measuring 3-4 mm. Bilateral renal
enhancement and contrast excretion is symmetric and normal. No
perinephric stranding. No hydronephrosis. Negative proximal ureters.

Unremarkable urinary bladder.

Stomach/Bowel: Negative rectum. Redundant but otherwise negative
sigmoid colon. Negative descending colon and splenic flexure. The
distal transverse colon is within normal limits.

Abnormal hepatic flexure and proximal transverse colon with an 8-9
cm segment of severe colonic wall thickening and moderate to severe
surrounding mesenteric inflammation. See coronal image 24). Gradual
tapering of the wall along the proximal and distal margins of the
abnormality. The abnormal bowel is in proximity to the gallbladder
which appears to remain normal. There is no mesenteric or regional
lymphadenopathy. No extraluminal gas. No abdominal free fluid.

The right colon and appendix are normal. Negative terminal ileum. No
dilated or abnormal small bowel loops. Stomach and duodenum are
within normal limits.

Vascular/Lymphatic: Mild Calcified aortic atherosclerosis. Major
arterial structures in the abdomen and pelvis are patent. There is
more pronounced bilateral internal iliac artery atherosclerosis. The
portal venous system is patent. No lymphadenopathy.

Reproductive: Surgically absent uterus. Ovaries are within normal
limits.

Other: No pelvic free fluid. Postoperative changes to the ventral
lower abdominal wall with no adverse features.

Musculoskeletal: No acute or suspicious osseous lesion identified.
Multilevel spinal degeneration including severe lower lumbar facet
and disc degeneration. Asymmetric right hip degeneration.
IMPRESSION: 1. Severe wall thickening and inflammation of an 8-9 cm segment of
the proximal transverse colon beginning in the right upper quadrant.
The gradual transition and absence of regional lymphadenopathy
favors colitis over adenocarcinoma. Post treatment follow-up
recommended.
2. No evidence of perforation or abscess. No other acute finding in
the abdomen or pelvis.
3. Hepatic steatosis. Right nephrolithiasis. Lumbar spine and right
hip degeneration.

## 2018-11-27 ENCOUNTER — Encounter (HOSPITAL_COMMUNITY): Payer: Self-pay

## 2018-11-27 ENCOUNTER — Inpatient Hospital Stay (HOSPITAL_COMMUNITY)
Admission: AD | Admit: 2018-11-27 | Discharge: 2018-11-27 | Disposition: A | Discharge status: Home / Self Care | Payer: Medicare Other | Attending: Obstetrics & Gynecology | Admitting: Obstetrics & Gynecology

## 2018-11-27 DIAGNOSIS — E119 Type 2 diabetes mellitus without complications: Secondary | ICD-10-CM | POA: Insufficient documentation

## 2018-11-27 DIAGNOSIS — M549 Dorsalgia, unspecified: Secondary | ICD-10-CM | POA: Insufficient documentation

## 2018-11-27 DIAGNOSIS — R109 Unspecified abdominal pain: Secondary | ICD-10-CM | POA: Diagnosis present

## 2018-11-27 DIAGNOSIS — Z90710 Acquired absence of both cervix and uterus: Secondary | ICD-10-CM | POA: Diagnosis not present

## 2018-11-27 DIAGNOSIS — I10 Essential (primary) hypertension: Secondary | ICD-10-CM | POA: Diagnosis not present

## 2018-11-27 DIAGNOSIS — T148XXA Other injury of unspecified body region, initial encounter: Secondary | ICD-10-CM

## 2018-11-27 DIAGNOSIS — R58 Hemorrhage, not elsewhere classified: Secondary | ICD-10-CM

## 2018-11-27 LAB — COMPREHENSIVE METABOLIC PANEL
ALT: 24 U/L (ref 0–44)
AST: 20 U/L (ref 15–41)
Albumin: 4.3 g/dL (ref 3.5–5.0)
Alkaline Phosphatase: 89 U/L (ref 38–126)
Anion gap: 10 (ref 5–15)
BUN: 27 mg/dL — ABNORMAL HIGH (ref 8–23)
CHLORIDE: 103 mmol/L (ref 98–111)
CO2: 22 mmol/L (ref 22–32)
CREATININE: 1.51 mg/dL — AB (ref 0.44–1.00)
Calcium: 9.5 mg/dL (ref 8.9–10.3)
GFR calc Af Amer: 40 mL/min — ABNORMAL LOW (ref >60)
GFR calc non Af Amer: 35 mL/min — ABNORMAL LOW (ref >60)
Glucose, Bld: 168 mg/dL — ABNORMAL HIGH (ref 70–99)
Potassium: 3.8 mmol/L (ref 3.5–5.1)
Sodium: 135 mmol/L (ref 135–145)
Total Bilirubin: 0.4 mg/dL (ref 0.3–1.2)
Total Protein: 7.9 g/dL (ref 6.5–8.1)

## 2018-11-27 LAB — URINALYSIS, ROUTINE W REFLEX MICROSCOPIC
Bilirubin Urine: NEGATIVE
Glucose, UA: >=500 mg/dL — AB
Hgb urine dipstick: NEGATIVE
Ketones, ur: NEGATIVE mg/dL
Nitrite: NEGATIVE
Protein, ur: NEGATIVE mg/dL
Specific Gravity, Urine: 1.02 (ref 1.005–1.030)
pH: 5 (ref 5.0–8.0)

## 2018-11-27 LAB — CBC
HCT: 43.1 % (ref 36.0–46.0)
Hemoglobin: 13.6 g/dL (ref 12.0–15.0)
MCH: 27.6 pg (ref 26.0–34.0)
MCHC: 31.6 g/dL (ref 30.0–36.0)
MCV: 87.4 fL (ref 80.0–100.0)
Platelets: 312 10*3/uL (ref 150–400)
RBC: 4.93 MIL/uL (ref 3.87–5.11)
RDW: 14.2 % (ref 11.5–15.5)
WBC: 11.3 10*3/uL — ABNORMAL HIGH (ref 4.0–10.5)
nRBC: 0 % (ref 0.0–0.2)

## 2018-11-27 LAB — URINALYSIS, MICROSCOPIC (REFLEX)

## 2018-11-27 NOTE — MAU Provider Note (Signed)
Chief Complaint:  No chief complaint on file.   Seen by provider at 0603  HPI: Sonia Fernandez is a 68 y.o. G2P2 who presents to maternity admissions reporting pain in her left flank and some diffuse pain all over her abdomen.  Thought it might be digestive so took peppermint but it did not help  Has had constipation issues. States had one episode of light bleeding, but not sure if it was rectal or vaginal   Has had a hysterectomy in past   No sexually active.  . She vaginal itching/burning, urinary symptoms, h/a, dizziness, n/v, or fever/chills.    Back Pain  This is a new problem. The current episode started in the past 7 days. The problem occurs intermittently. The problem is unchanged. Pain location: left flank. The quality of the pain is described as aching and cramping. The pain does not radiate. The symptoms are aggravated by bending and twisting. Associated symptoms include abdominal pain. Pertinent negatives include no dysuria, fever, headaches, paresthesias, pelvic pain or weakness.   RN Note: Pain in her left side that has been going on for the past 3-4 days-mostly feeling at night.  Took peppermint for the pain without relief.  Hasn't ever had this pain before.  No changes in activity in the past few days.  States a month ago she lifted her mother off the floor after a fall and had pain and bleeding afterwards.  Reports having some bleeding this week.  No recent intercourse.    Past Medical History: Past Medical History:  Diagnosis Date  . Diabetes mellitus (HCC)   . Hypertension     Past obstetric history: OB History  Gravida Para Term Preterm AB Living  2 2          SAB TAB Ectopic Multiple Live Births               # Outcome Date GA Lbr Len/2nd Weight Sex Delivery Anes PTL Lv  2 Para 1979     CS-Unspec     1 Para 1973     CS-Unspec       Past Surgical History: Past Surgical History:  Procedure Laterality Date  . ABDOMINAL HYSTERECTOMY    . CESAREAN SECTION     x2     Family History: Family History  Problem Relation Age of Onset  . Breast cancer Neg Hx     Social History: Social History   Tobacco Use  . Smoking status: Never Smoker  . Smokeless tobacco: Never Used  Substance Use Topics  . Alcohol use: Yes    Alcohol/week: 1.0 standard drinks    Types: 1 Glasses of wine per week  . Drug use: No    Allergies: No Known Allergies  Meds:  Medications Prior to Admission  Medication Sig Dispense Refill Last Dose  . aspirin 325 MG tablet Take 325 mg by mouth daily.   11/26/2018 at Unknown time  . atorvastatin (LIPITOR) 40 MG tablet Take 40 mg by mouth daily.   11/26/2018 at Unknown time  . diltiazem (DILACOR XR) 180 MG 24 hr capsule Take 360 mg by mouth daily.   11/26/2018 at Unknown time  . irbesartan (AVAPRO) 150 MG tablet Take 150 mg by mouth at bedtime.   11/26/2018 at Unknown time  . metFORMIN (GLUCOPHAGE) 500 MG tablet Take by mouth 2 (two) times daily with a meal.   11/26/2018 at Unknown time  . triamterene-hydrochlorothiazide (MAXZIDE-25) 37.5-25 MG per tablet Take 1 tablet by mouth  daily.   11/26/2018 at Unknown time  . aspirin 81 MG tablet Take 325 mg by mouth daily.     at not taking  . canagliflozin (INVOKANA) 100 MG TABS tablet Take by mouth daily before breakfast.     . HYDROcodone-acetaminophen (NORCO/VICODIN) 5-325 MG tablet Take 1 tablet by mouth every 6 (six) hours as needed for severe pain. 10 tablet 0  at not taking  . metFORMIN (GLUCOPHAGE) 1000 MG tablet Take 500 mg by mouth 2 (two) times daily with a meal.   07/16/2012 at Unknown  . ondansetron (ZOFRAN ODT) 4 MG disintegrating tablet Take 1 tablet (4 mg total) by mouth every 8 (eight) hours as needed for nausea or vomiting. 20 tablet 0  at not taking    I have reviewed patient's Past Medical Hx, Surgical Hx, Family Hx, Social Hx, medications and allergies.  ROS:  Review of Systems  Constitutional: Negative for fever.  Gastrointestinal: Positive for abdominal pain.   Genitourinary: Negative for dysuria and pelvic pain.  Musculoskeletal: Positive for back pain.  Neurological: Negative for weakness, headaches and paresthesias.   Other systems negative    Physical Exam   Patient Vitals for the past 24 hrs:  BP Temp Pulse Resp SpO2 Height Weight  11/27/18 0538 115/86 97.9 F (36.6 C) 73 17 100 % 5\' 4"  (1.626 m) 84.9 kg   Constitutional: Well-developed, well-nourished female in no acute distress.  Cardiovascular: normal rate and rhythm Respiratory: normal effort, no distress.  GI: Abd soft, non-tender.  Nondistended.  No rebound, No guarding.  Bowel Sounds audible  MS: Extremities nontender, no edema, normal ROM  Left flank/low back is slightly tender to palpation.  Neurologic: Alert and oriented x 4.   Grossly nonfocal. GU: Neg CVAT. Skin:  Warm and Dry Psych:  Affect appropriate.  PELVIC EXAM: Cervix surgically absent, scant clear discharge, vaginal walls and external genitalia normal except for small pustule on mons ("hair bump")   No blood noted.  No lesions.   Bimanual exam: Cervix absent, uterus absent, difficult to feel ovaries,  exam limited by body habitus.   Labs: Results for orders placed or performed during the hospital encounter of 11/27/18 (from the past 24 hour(s))  Urinalysis, Routine w reflex microscopic     Status: Abnormal   Collection Time: 11/27/18  5:48 AM  Result Value Ref Range   Color, Urine YELLOW YELLOW   APPearance CLEAR CLEAR   Specific Gravity, Urine 1.020 1.005 - 1.030   pH 5.0 5.0 - 8.0   Glucose, UA >=500 (A) NEGATIVE mg/dL   Hgb urine dipstick NEGATIVE NEGATIVE   Bilirubin Urine NEGATIVE NEGATIVE   Ketones, ur NEGATIVE NEGATIVE mg/dL   Protein, ur NEGATIVE NEGATIVE mg/dL   Nitrite NEGATIVE NEGATIVE   Leukocytes,Ua SMALL (A) NEGATIVE  Urinalysis, Microscopic (reflex)     Status: Abnormal   Collection Time: 11/27/18  5:48 AM  Result Value Ref Range   RBC / HPF 6-10 0 - 5 RBC/hpf   WBC, UA 6-10 0 - 5  WBC/hpf   Bacteria, UA FEW (A) NONE SEEN   Squamous Epithelial / LPF 0-5 0 - 5  CBC     Status: Abnormal   Collection Time: 11/27/18  6:22 AM  Result Value Ref Range   WBC 11.3 (H) 4.0 - 10.5 K/uL   RBC 4.93 3.87 - 5.11 MIL/uL   Hemoglobin 13.6 12.0 - 15.0 g/dL   HCT 29.543.1 28.436.0 - 13.246.0 %   MCV 87.4 80.0 - 100.0 fL  MCH 27.6 26.0 - 34.0 pg   MCHC 31.6 30.0 - 36.0 g/dL   RDW 02.2 33.6 - 12.2 %   Platelets 312 150 - 400 K/uL   nRBC 0.0 0.0 - 0.2 %  Comprehensive metabolic panel     Status: Abnormal   Collection Time: 11/27/18  6:22 AM  Result Value Ref Range   Sodium 135 135 - 145 mmol/L   Potassium 3.8 3.5 - 5.1 mmol/L   Chloride 103 98 - 111 mmol/L   CO2 22 22 - 32 mmol/L   Glucose, Bld 168 (H) 70 - 99 mg/dL   BUN 27 (H) 8 - 23 mg/dL   Creatinine, Ser 4.49 (H) 0.44 - 1.00 mg/dL   Calcium 9.5 8.9 - 75.3 mg/dL   Total Protein 7.9 6.5 - 8.1 g/dL   Albumin 4.3 3.5 - 5.0 g/dL   AST 20 15 - 41 U/L   ALT 24 0 - 44 U/L   Alkaline Phosphatase 89 38 - 126 U/L   Total Bilirubin 0.4 0.3 - 1.2 mg/dL   GFR calc non Af Amer 35 (L) >60 mL/min   GFR calc Af Amer 40 (L) >60 mL/min   Anion gap 10 5 - 15      Imaging:  No results found.  MAU Course/MDM: I have ordered labs as follows:see above,  Results similar to prior testing.  No leukocytosis.  Does have elevated glucose and creatinine, which have been elevated in past. Urine showed no sign of infection or hematuria.  Imaging ordered: none Results reviewed.   Discussed I cannot find any blood.  Has had colonoscopy recently  Suspect may be hemorrhoidal bleeding.  Suspect pain is musculoskeletal spasm, as it started after her sudden move to pick her mother off floor and is worse with twisting   Discussed Flexeril may be helpful, but given her complex medical history I am hesitant to prescribe.  Advised her to use heat packs to area and call her primary doctor to ask him if he thinks Flexeril is safe for her to take.  Pt stable at time of  discharge.  Assessment: Left flank pain Likely muscle spasm Bleeding, unclear source, no obvious bleeding from vaginal tract Multiple medical problems  Plan: Discharge home Recommend call primary doctor today for followup  Warm packs to flank area  Encouraged to return here or to other Urgent Care/ED if she develops worsening of symptoms, increase in pain, fever, or other concerning symptoms.   Wynelle Bourgeois CNM, MSN Certified Nurse-Midwife 11/27/2018 6:02 AM

## 2018-11-27 NOTE — Discharge Instructions (Signed)
Flank Pain, Adult Flank pain is pain in your side. The flank is the area of your side between your upper belly (abdomen) and your back. The pain may occur over a short time (acute), or it may be long-term or come back often (chronic). It may be mild or very bad. Pain in this area can be caused by many different things. Follow these instructions at home:   Drink enough fluid to keep your pee (urine) clear or pale yellow.  Rest as told by your doctor.  Take over-the-counter and prescription medicines only as told by your doctor.  Keep a journal to keep track of: ? What has caused your flank pain. ? What has made it feel better.  Keep all follow-up visits as told by your doctor. This is important. Contact a doctor if:  Medicine does not help your pain.  You have new symptoms.  Your pain gets worse.  You have a fever.  Your symptoms last longer than 2-3 days.  You have trouble peeing.  You are peeing more often than normal. Get help right away if:  You have trouble breathing.  You are short of breath.  Your belly hurts, or it is swollen or red.  You feel sick to your stomach (nauseous).  You throw up (vomit).  You feel like you will pass out, or you do pass out (faint).  You have blood in your pee. Summary  Flank pain is pain in your side. The flank is the area of your side between your upper belly (abdomen) and your back.  Flank pain may occur over a short time (acute), or it may be long-term or come back often (chronic). It may be mild or very bad.  Pain in this area can be caused by many different things.  Contact your doctor if your symptoms get worse or they last longer than 2-3 days. This information is not intended to replace advice given to you by your health care provider. Make sure you discuss any questions you have with your health care provider. Document Released: 07/10/2008 Document Revised: 01/21/2017 Document Reviewed: 01/21/2017 Elsevier  Interactive Patient Education  2019 [employer]  Muscle Cramps and Spasms Muscle cramps and spasms are when muscles tighten by themselves. They usually get better within minutes. Muscle cramps are painful. They are usually stronger and last longer than muscle spasms. Muscle spasms may or may not be painful. They can last a few seconds or much longer. Cramps and spasms can affect any muscle, but they occur most often in the calf muscles of the leg. They are usually not caused by a serious problem. In many cases, the cause is not known. Some common causes include:  Doing more physical work or exercise than your body is ready for.  Using the muscles too much (overuse) by repeating certain movements too many times.  Staying in a certain position for a long time.  Playing a sport or doing an activity without preparing properly.  Using bad form or technique while playing a sport or doing an activity.  Not having enough water in your body (dehydration).  Injury.  Side effects of some medicines.  Low levels of the salts and minerals in your blood (electrolytes), such as low potassium or calcium. Follow these instructions at home: Managing pain and stiffness      Massage, stretch, and relax the muscle. Do this for many minutes at a time.  If told, put heat on tight or tense muscles as often as  told by your doctor. Use the heat source that your doctor recommends, such as a moist heat pack or a heating pad. ? Place a towel between your skin and the heat source. ? Leave the heat on for 20-30 minutes. ? Remove the heat if your skin turns bright red. This is very important if you are not able to feel pain, heat, or cold. You may have a greater risk of getting burned.  If told, put ice on the affected area. This may help if you are sore or have pain after a cramp or spasm. ? Put ice in a plastic bag. ? Place a towel between your skin and the bag. ? Leave the ice on for 20 minutes, 2-3  times a day.  Try taking hot showers or baths to help relax tight muscles. Eating and drinking  Drink enough fluid to keep your pee (urine) pale yellow.  Eat a healthy diet to help ensure that your muscles work well. This should include: ? Fruits and vegetables. ? Lean protein. ? Whole grains. ? Low-fat or nonfat dairy products. General instructions  If you are having cramps often, avoid intense exercise for several days.  Take over-the-counter and prescription medicines only as told by your doctor.  Watch for any changes in your symptoms.  Keep all follow-up visits as told by your doctor. This is important. Contact a doctor if:  Your cramps or spasms get worse or happen more often.  Your cramps or spasms do not get better with time. Summary  Muscle cramps and spasms are when muscles tighten by themselves. They usually get better within minutes.  Cramps and spasms occur most often in the calf muscles of the leg.  Massage, stretch, and relax the muscle. This may help the cramp or spasm go away.  Drink enough fluid to keep your pee (urine) pale yellow. This information is not intended to replace advice given to you by your health care provider. Make sure you discuss any questions you have with your health care provider. Document Released: 09/13/2008 Document Revised: 02/24/2018 Document Reviewed: 02/24/2018 Elsevier Interactive Patient Education  2019 [employer]

## 2018-11-27 NOTE — MAU Note (Signed)
Pain in her left side that has been going on for the past 3-4 days-mostly feeling at night.  Took peppermint for the pain without relief.  Hasn't ever had this pain before.  No changes in activity in the past few days.  States a month ago she lifted her mother off the floor after a fall and had pain and bleeding afterwards.  Reports having some bleeding this week.  No recent intercourse.

## 2019-08-25 ENCOUNTER — Other Ambulatory Visit: Payer: Self-pay | Admitting: Internal Medicine

## 2019-08-25 DIAGNOSIS — Z1231 Encounter for screening mammogram for malignant neoplasm of breast: Secondary | ICD-10-CM

## 2019-09-28 ENCOUNTER — Ambulatory Visit
Admission: RE | Admit: 2019-09-28 | Discharge: 2019-09-28 | Disposition: A | Discharge status: Home / Self Care | Payer: Medicare Other | Source: Ambulatory Visit | Attending: Internal Medicine | Admitting: Internal Medicine

## 2019-09-28 ENCOUNTER — Other Ambulatory Visit: Payer: Self-pay

## 2019-09-28 DIAGNOSIS — Z1231 Encounter for screening mammogram for malignant neoplasm of breast: Secondary | ICD-10-CM

## 2019-10-15 ENCOUNTER — Ambulatory Visit: Payer: Medicare Other

## 2019-11-26 ENCOUNTER — Other Ambulatory Visit: Payer: Self-pay | Admitting: Internal Medicine

## 2019-11-26 DIAGNOSIS — M810 Age-related osteoporosis without current pathological fracture: Secondary | ICD-10-CM

## 2020-03-01 ENCOUNTER — Other Ambulatory Visit: Payer: Self-pay

## 2020-03-01 ENCOUNTER — Ambulatory Visit
Admission: RE | Admit: 2020-03-01 | Discharge: 2020-03-01 | Disposition: A | Discharge status: Home / Self Care | Payer: Medicare Other | Source: Ambulatory Visit | Attending: Internal Medicine | Admitting: Internal Medicine

## 2020-03-01 DIAGNOSIS — M810 Age-related osteoporosis without current pathological fracture: Secondary | ICD-10-CM

## 2020-08-26 ENCOUNTER — Other Ambulatory Visit: Payer: Self-pay | Admitting: Internal Medicine

## 2020-08-26 DIAGNOSIS — Z1231 Encounter for screening mammogram for malignant neoplasm of breast: Secondary | ICD-10-CM

## 2020-10-06 ENCOUNTER — Other Ambulatory Visit: Payer: Self-pay

## 2020-10-06 ENCOUNTER — Ambulatory Visit
Admission: RE | Admit: 2020-10-06 | Discharge: 2020-10-06 | Disposition: A | Discharge status: Home / Self Care | Payer: Medicare PPO | Source: Ambulatory Visit | Attending: Internal Medicine | Admitting: Internal Medicine

## 2020-10-06 DIAGNOSIS — Z1231 Encounter for screening mammogram for malignant neoplasm of breast: Secondary | ICD-10-CM

## 2020-11-03 ENCOUNTER — Ambulatory Visit: Payer: Medicare PPO

## 2020-11-11 ENCOUNTER — Other Ambulatory Visit: Payer: Self-pay

## 2020-11-11 ENCOUNTER — Ambulatory Visit
Admission: RE | Admit: 2020-11-11 | Discharge: 2020-11-11 | Disposition: A | Discharge status: Home / Self Care | Payer: Medicare PPO | Source: Ambulatory Visit | Attending: Internal Medicine | Admitting: Internal Medicine

## 2020-11-11 DIAGNOSIS — Z1231 Encounter for screening mammogram for malignant neoplasm of breast: Secondary | ICD-10-CM

## 2021-10-03 ENCOUNTER — Other Ambulatory Visit: Payer: Self-pay | Admitting: Internal Medicine

## 2021-10-03 DIAGNOSIS — Z1231 Encounter for screening mammogram for malignant neoplasm of breast: Secondary | ICD-10-CM

## 2021-11-01 ENCOUNTER — Other Ambulatory Visit: Payer: Self-pay | Admitting: Physician Assistant

## 2021-11-01 DIAGNOSIS — Z13820 Encounter for screening for osteoporosis: Secondary | ICD-10-CM

## 2021-11-13 ENCOUNTER — Other Ambulatory Visit: Payer: Self-pay

## 2021-11-13 ENCOUNTER — Ambulatory Visit
Admission: RE | Admit: 2021-11-13 | Discharge: 2021-11-13 | Disposition: A | Discharge status: Home / Self Care | Payer: Medicare PPO | Source: Ambulatory Visit | Attending: Internal Medicine | Admitting: Internal Medicine

## 2021-11-13 DIAGNOSIS — Z1231 Encounter for screening mammogram for malignant neoplasm of breast: Secondary | ICD-10-CM

## 2022-03-29 ENCOUNTER — Ambulatory Visit
Admission: RE | Admit: 2022-03-29 | Discharge: 2022-03-29 | Disposition: A | Discharge status: Home / Self Care | Payer: Medicare PPO | Source: Ambulatory Visit | Attending: Physician Assistant | Admitting: Physician Assistant

## 2022-03-29 DIAGNOSIS — Z13820 Encounter for screening for osteoporosis: Secondary | ICD-10-CM

## 2022-10-10 ENCOUNTER — Other Ambulatory Visit: Payer: Self-pay | Admitting: Internal Medicine

## 2022-10-10 DIAGNOSIS — Z1231 Encounter for screening mammogram for malignant neoplasm of breast: Secondary | ICD-10-CM

## 2022-11-29 ENCOUNTER — Ambulatory Visit
Admission: RE | Admit: 2022-11-29 | Discharge: 2022-11-29 | Disposition: A | Discharge status: Home / Self Care | Payer: Medicare PPO | Source: Ambulatory Visit | Attending: Internal Medicine | Admitting: Internal Medicine

## 2022-11-29 DIAGNOSIS — Z1231 Encounter for screening mammogram for malignant neoplasm of breast: Secondary | ICD-10-CM

## 2023-12-18 ENCOUNTER — Other Ambulatory Visit: Payer: Self-pay | Admitting: Internal Medicine

## 2023-12-18 DIAGNOSIS — Z0000 Encounter for general adult medical examination without abnormal findings: Secondary | ICD-10-CM

## 2023-12-26 ENCOUNTER — Ambulatory Visit
Admission: RE | Admit: 2023-12-26 | Discharge: 2023-12-26 | Disposition: A | Discharge status: Home / Self Care | Source: Ambulatory Visit | Attending: Internal Medicine | Admitting: Internal Medicine

## 2023-12-26 DIAGNOSIS — Z0000 Encounter for general adult medical examination without abnormal findings: Secondary | ICD-10-CM
